# Patient Record
Sex: Female | Born: 1950 | Hispanic: No | Marital: Married | Smoking: Current every day smoker
Health system: Southern US, Community
[De-identification: ages and names within clinical notes are randomized; demographics above are authoritative.]

---

## 2019-02-23 ENCOUNTER — Telehealth: Payer: Self-pay | Admitting: *Deleted

## 2019-02-23 DIAGNOSIS — Z87891 Personal history of nicotine dependence: Secondary | ICD-10-CM

## 2019-02-23 DIAGNOSIS — Z122 Encounter for screening for malignant neoplasm of respiratory organs: Secondary | ICD-10-CM

## 2019-02-23 NOTE — Telephone Encounter (Signed)
Received referral for initial lung cancer screening scan. Contacted patient and obtained smoking history,(current, 33 pack year) as well as answering questions related to screening process. Patient denies signs of lung cancer such as weight loss or hemoptysis. Patient denies comorbidity that would prevent curative treatment if lung cancer were found. Patient is scheduled for shared decision making visit and CT scan on 03/08/19 at 130pm.

## 2019-03-07 ENCOUNTER — Encounter: Payer: Self-pay | Admitting: Oncology

## 2019-03-08 ENCOUNTER — Inpatient Hospital Stay: Payer: Medicare Other | Attending: Oncology | Admitting: Oncology

## 2019-03-08 ENCOUNTER — Ambulatory Visit
Admission: RE | Admit: 2019-03-08 | Discharge: 2019-03-08 | Disposition: A | Discharge status: Home / Self Care | Payer: Medicare Other | Source: Ambulatory Visit | Attending: Oncology | Admitting: Oncology

## 2019-03-08 ENCOUNTER — Other Ambulatory Visit: Payer: Self-pay

## 2019-03-08 DIAGNOSIS — Z122 Encounter for screening for malignant neoplasm of respiratory organs: Secondary | ICD-10-CM | POA: Insufficient documentation

## 2019-03-08 DIAGNOSIS — Z87891 Personal history of nicotine dependence: Secondary | ICD-10-CM | POA: Diagnosis not present

## 2019-03-08 NOTE — Progress Notes (Signed)
Virtual Visit via Video Note  I connected with Renee Wise on 03/08/19 at  1:30 PM EST by a video enabled telemedicine application and verified that I am speaking with the correct person using two identifiers.  Location: Patient: [facility] Provider: Home   I discussed the limitations of evaluation and management by telemedicine and the availability of in person appointments. The patient expressed understanding and agreed to proceed.  I discussed the assessment and treatment plan with the patient. The patient was provided an opportunity to ask questions and all were answered. The patient agreed with the plan and demonstrated an understanding of the instructions.   The patient was advised to call back or seek an in-person evaluation if the symptoms worsen or if the condition fails to improve as anticipated.   In accordance with CMS guidelines, patient has met eligibility criteria including age, absence of signs or symptoms of lung cancer.  Social History   Tobacco Use  . Smoking status: Current Every Day Smoker    Packs/day: 1.00    Years: 33.00    Pack years: 33.00    Types: Cigarettes  Substance Use Topics  . Alcohol use: Not on file  . Drug use: Not on file      A shared decision-making session was conducted prior to the performance of CT scan. This includes one or more decision aids, includes benefits and harms of screening, follow-up diagnostic testing, over-diagnosis, false positive rate, and total radiation exposure.   Counseling on the importance of adherence to annual lung cancer LDCT screening, impact of co-morbidities, and ability or willingness to undergo diagnosis and treatment is imperative for compliance of the program.   Counseling on the importance of continued smoking cessation for former smokers; the importance of smoking cessation for current smokers, and information about tobacco cessation interventions have been given to patient including [place] and  [employer] quit [place] programs.   Written order for lung cancer screening with LDCT has been given to the patient and any and all questions have been answered to the best of my abilities.    Yearly follow up will be coordinated by Burgess Estelle, Thoracic Navigator.  I provided 15 minutes of face-to-face video visit time during this encounter, and > 50% was spent counseling as documented under my assessment & plan.   Jacquelin Hawking, NP

## 2019-03-10 ENCOUNTER — Encounter: Payer: Self-pay | Admitting: *Deleted

## 2020-02-29 ENCOUNTER — Telehealth: Payer: Self-pay | Admitting: *Deleted

## 2020-02-29 NOTE — Telephone Encounter (Signed)
Attempted to contact and schedule lung screening scan. Message left for patient to call back to schedule. 

## 2020-04-09 ENCOUNTER — Telehealth: Payer: Self-pay | Admitting: *Deleted

## 2020-04-09 NOTE — Telephone Encounter (Signed)
Attempted to contact and schedule lung screening scan. Message left for patient to call back to schedule. 

## 2020-05-17 ENCOUNTER — Telehealth: Payer: Self-pay

## 2020-05-17 NOTE — Telephone Encounter (Signed)
Contacted pt to let her know that she is due for CT lung screening and she state that she has had this done already in December 20221 at [facility] medical with Dr. Madaline Savage.

## 2020-12-21 IMAGING — CT CT CHEST LUNG CANCER SCREENING LOW DOSE W/O CM
2 of 5 series · 15 of 40 positions shown, 18 images · non-contrast
Comparison: None

CLINICAL DATA: Current smoker. Thirty-three pack-year history.
Asymptomatic. History of choriocarcinoma of the uterus and 2577.

EXAM:
CT CHEST WITHOUT CONTRAST LOW-DOSE FOR LUNG CANCER SCREENING
TECHNIQUE: Multidetector CT imaging of the chest was performed following the
standard protocol without IV contrast.

[Series 3: lung 1.00 · axial · 0.69mm/px · z∈[-1219,-907]mm · 12 of 344 slices shown, 15 images]
[im 16/344  mediastinal]
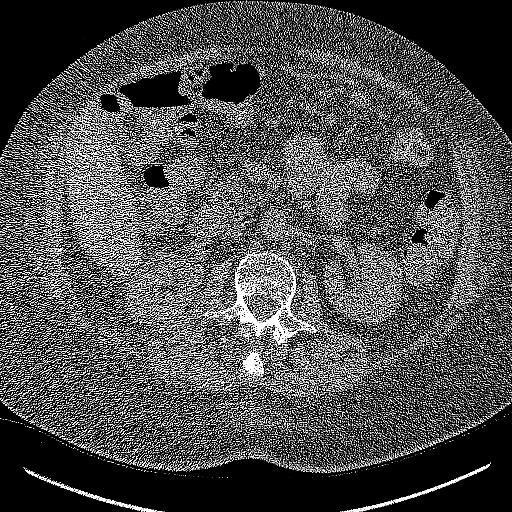
[im 16/344  lung]
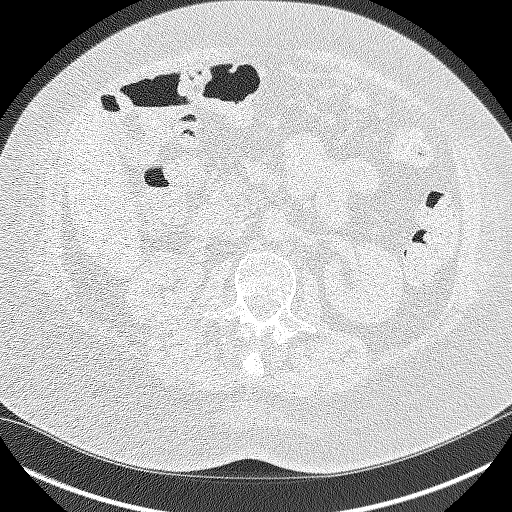
[im 47/344  lung]
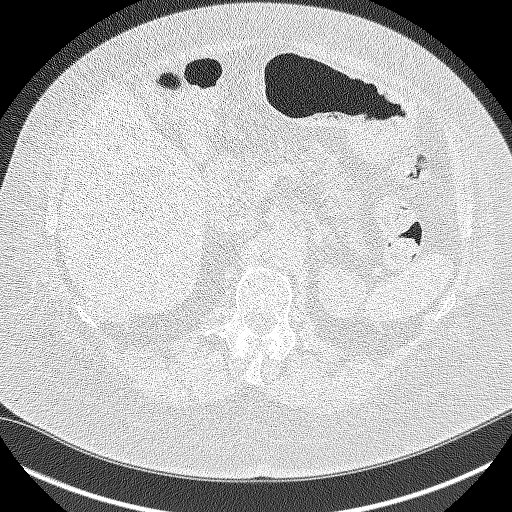
[im 78/344  lung]
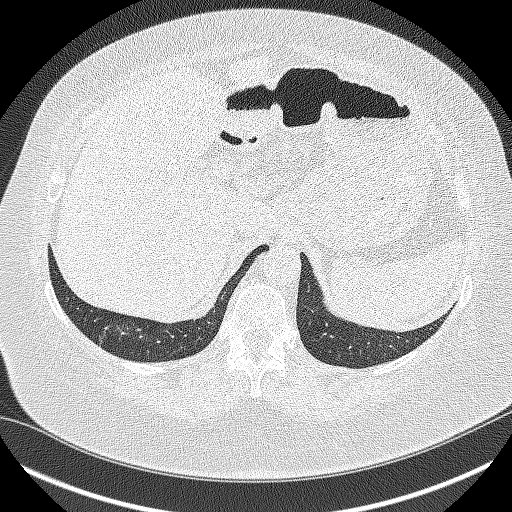
[im 110/344  lung]
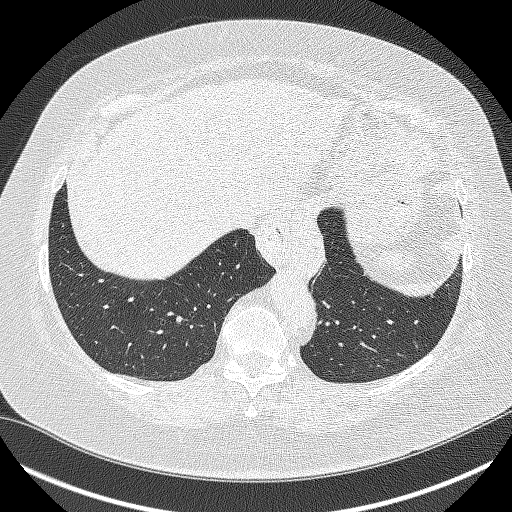
[im 125/344  mediastinal]
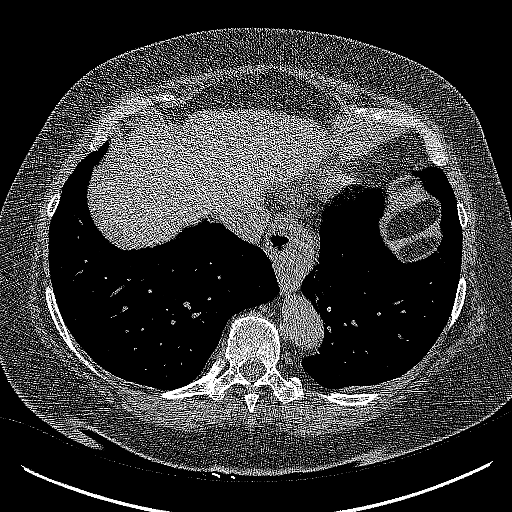
[im 125/344  lung]
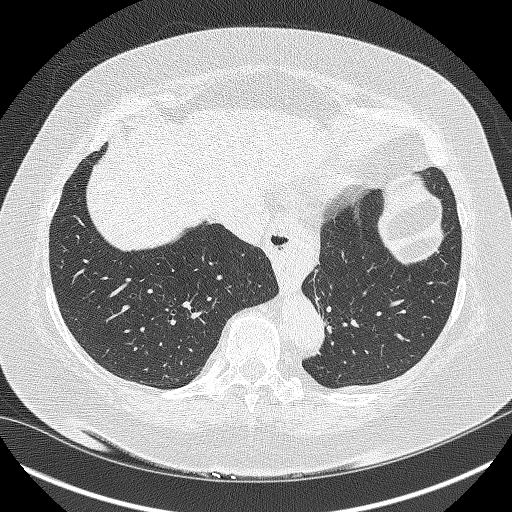
[im 156/344  lung]
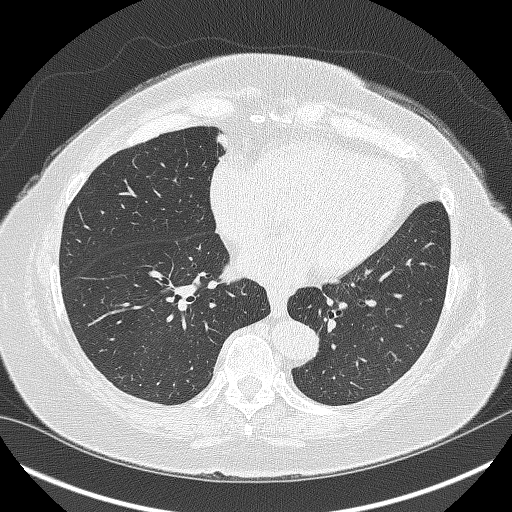
[im 188/344  lung]
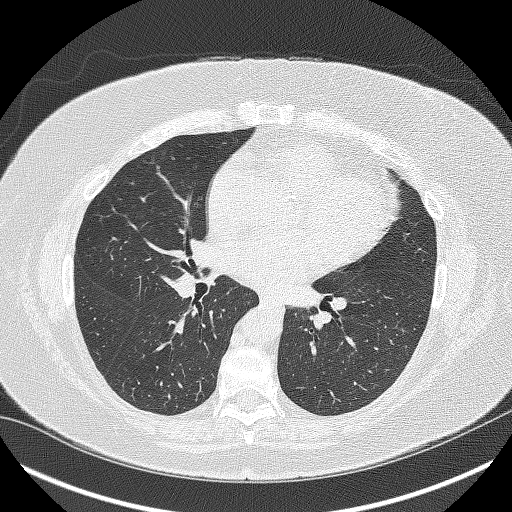
[im 219/344  lung]
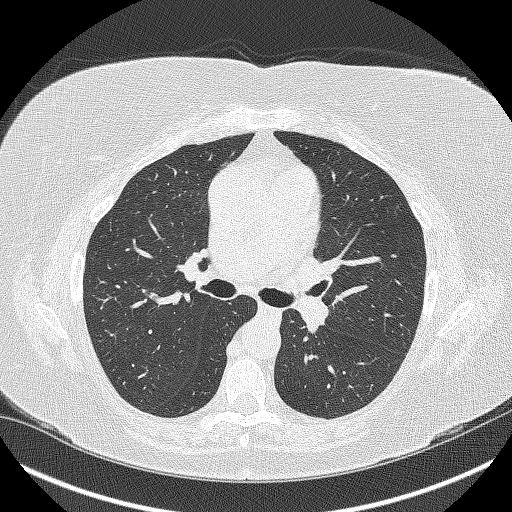
[im 234/344  mediastinal]
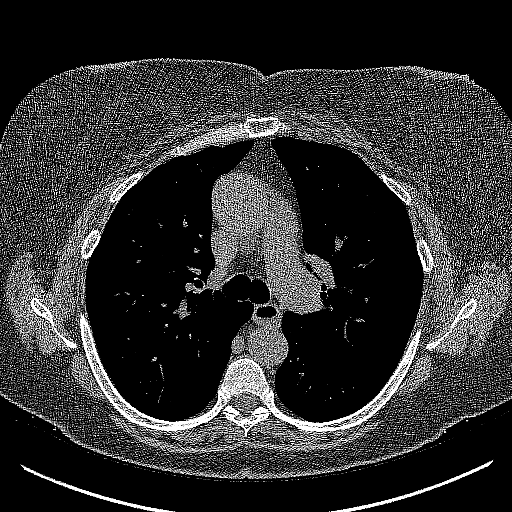
[im 234/344  lung]
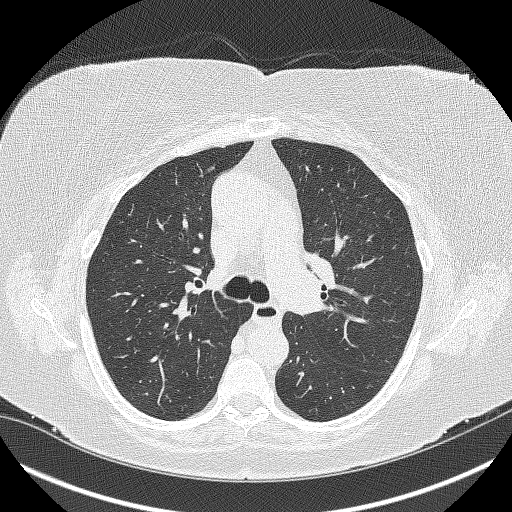
[im 266/344  lung]
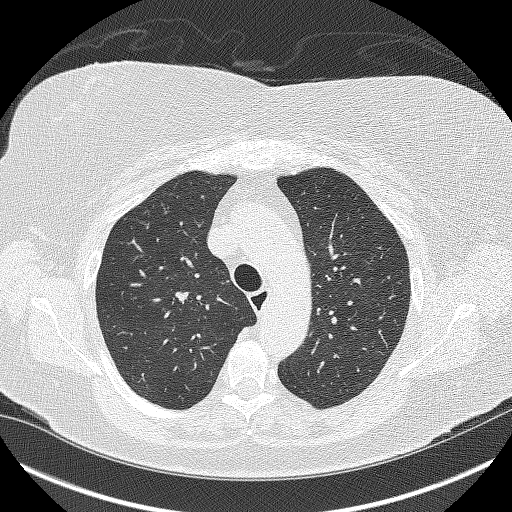
[im 297/344  lung]
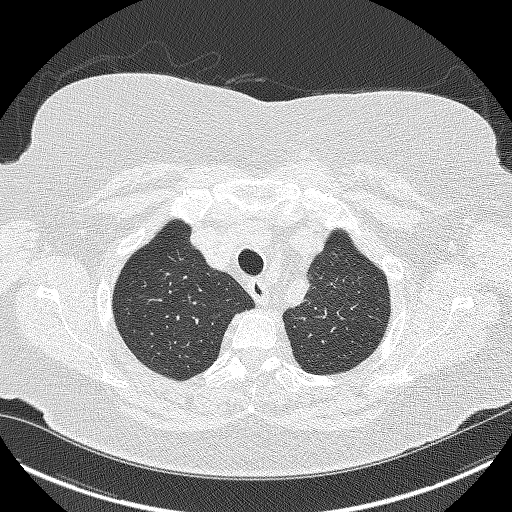
[im 328/344  lung]
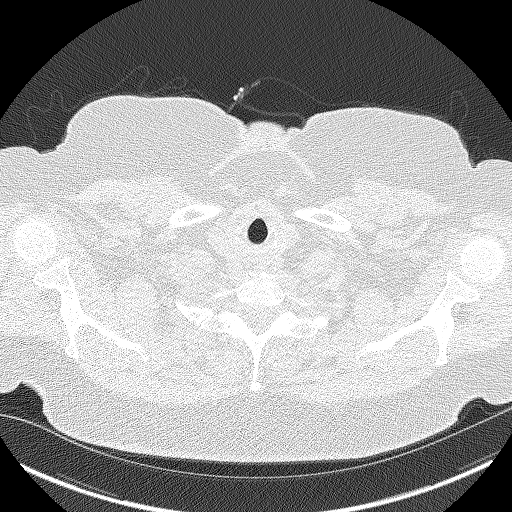

[Series 4: coronals lung 1.00 cor · coronal · 0.67mm/px · 3 of 332 slices shown]
[im 67/332  lung]
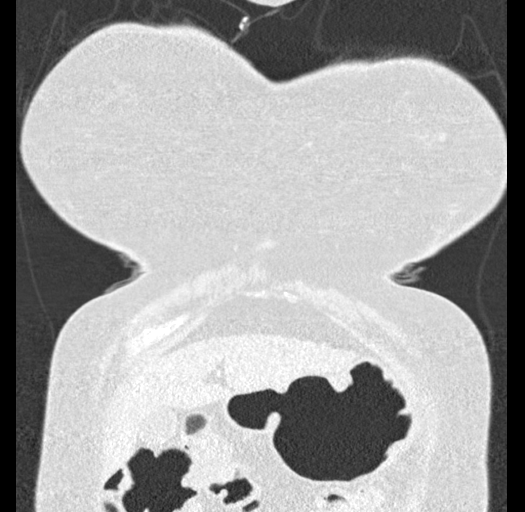
[im 133/332  lung]
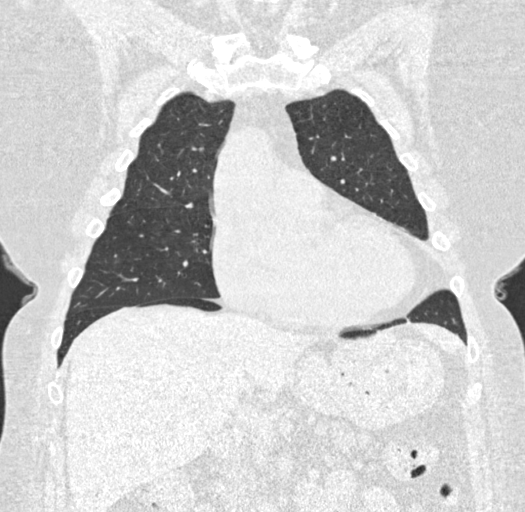
[im 199/332  lung]
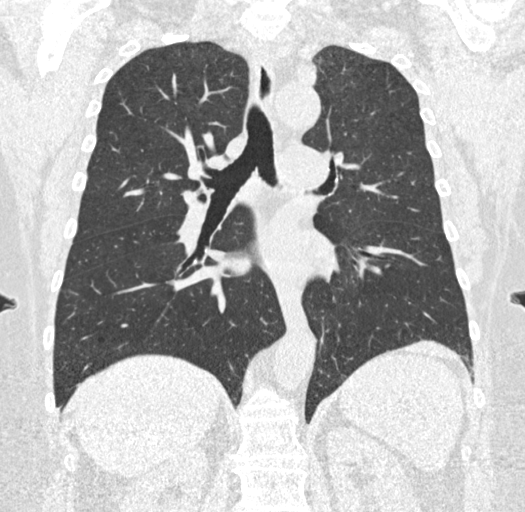

[15 of 40 positions shown; findings below may reference images not displayed]

FINDINGS: Cardiovascular: Normal heart size. No pericardial effusion. Aortic
atherosclerosis.

Mediastinum/Nodes: Normal appearance of the thyroid gland. The
trachea appears patent and is midline. Small hiatal hernia. No
mediastinal or hilar adenopathy.

Lungs/Pleura: No pleural effusion identified. No airspace
consolidation, atelectasis or pneumothorax. Several small
noncalcified lung nodules are identified within the left lung. The
largest has an equivalent diameter of 4.6 mm.

Upper Abdomen: No acute abnormality.

Musculoskeletal: No chest wall mass or suspicious bone lesions
identified.
IMPRESSION: 1. Lung-RADS 2, benign appearance or behavior. Continue annual
screening with low-dose chest CT without contrast in 12 months.
2. Aortic atherosclerosis.

Aortic Atherosclerosis ([id number]).
